# Patient Record
Sex: Male | Born: 1995 | Race: White | Hispanic: No | Marital: Single
Health system: Southern US, Community
[De-identification: ages and names within clinical notes are randomized; demographics above are authoritative.]

---
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
## 2016-09-12 ENCOUNTER — Ambulatory Visit (HOSPITAL_COMMUNITY)
Admission: EM | Admit: 2016-09-12 | Discharge: 2016-09-12 | Disposition: A | Discharge status: Home / Self Care | Payer: BLUE CROSS/BLUE SHIELD | Attending: Family Medicine | Admitting: Family Medicine

## 2016-09-12 ENCOUNTER — Encounter (HOSPITAL_COMMUNITY): Payer: Self-pay | Admitting: Family Medicine

## 2016-09-12 DIAGNOSIS — L509 Urticaria, unspecified: Secondary | ICD-10-CM

## 2016-09-12 MED ORDER — PREDNISONE 20 MG PO TABS: ORAL_TABLET | ORAL | 0 refills | Status: AC

## 2016-09-12 NOTE — ED Provider Notes (Signed)
[facility]    CSN: [id number] Arrival date & time: 09/12/16  1923     History   Chief Complaint Chief Complaint  Patient presents with  . Allergic Reaction    HPI Joseph Knapp is a 21 y.o. male.   Pt here for rash and allergic reaction that has been going on for months. sts worse than ever. sts unsure what is causing it. sts that he changed his detergents and eating the same and not changed. sts itchy.   Patient notes that the rash is most intense and usually begins along his waistband where the elastic touches.      History reviewed. No pertinent past medical history.  There are no active problems to display for this patient.   History reviewed. No pertinent surgical history.     Home Medications    Prior to Admission medications   Medication Sig Start Date End Date Taking? Authorizing Provider  predniSONE (DELTASONE) 20 MG tablet Two daily with food 09/12/16   Elvina SidleLauenstein, Mamye Bolds, MD    Family History No family history on file.  Social History Social History  Substance Use Topics  . Smoking status: Not on file  . Smokeless tobacco: Not on file  . Alcohol use Not on file     Allergies   Patient has no known allergies.   Review of Systems Review of Systems  Skin: Positive for rash.  All other systems reviewed and are negative.    Physical Exam Triage Vital Signs ED Triage Vitals [09/12/16 2013]  Enc Vitals Group     BP 134/69     Pulse Rate 75     Resp 18     Temp 98.3 F (36.8 C)     Temp src      SpO2 98 %     Weight      Height      Head Circumference      Peak Flow      Pain Score      Pain Loc      Pain Edu?      Excl. in GC?    No data found.   Updated Vital Signs BP 134/69   Pulse 75   Temp 98.3 F (36.8 C)   Resp 18   SpO2 98%   Visual Acuity Right Eye Distance:   Left Eye Distance:   Bilateral Distance:    Right Eye Near:   Left Eye Near:    Bilateral Near:     Physical Exam  Constitutional:  He is oriented to person, place, and time. He appears well-developed and well-nourished.  HENT:  Mouth/Throat: Oropharynx is clear and moist.  Eyes: Pupils are equal, round, and reactive to light. Conjunctivae are normal.  Neck: Normal range of motion. Neck supple.  Pulmonary/Chest: Effort normal.  Musculoskeletal: Normal range of motion.  Neurological: He is alert and oriented to person, place, and time.  Skin: Skin is warm and dry.  Patient has diffuse urticaria, most intensely along the waistline. He is wearing nylon shorts with latex waistband  Nursing note and vitals reviewed.    UC Treatments / Results  Labs (all labs ordered are listed, but only abnormal results are displayed) Labs Reviewed - No data to display  EKG  EKG Interpretation None       Radiology No results found.  Procedures Procedures (including critical care time)  Medications Ordered in UC Medications - No data to display   Initial Impression / Assessment and  Plan / UC Course  I have reviewed the triage vital signs and the nursing notes.  Pertinent labs & imaging results that were available during my care of the patient were reviewed by me and considered in my medical decision making (see chart for details).     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  New Prescriptions New Prescriptions   PREDNISONE (DELTASONE) 20 MG TABLET    Two daily with food     Controlled Substance Prescriptions [place] Controlled Substance Registry consulted? Not Applicable   Elvina Sidle, MD 09/12/16 2048

## 2016-09-12 NOTE — ED Triage Notes (Signed)
Pt here for rash and allergic reaction that has been going on for months. sts worse than ever. sts unsure what is causing it. sts that he changed his detergents and eating the same and not changed. sts itchy.
